# Patient Record
Sex: Male | Born: 1990 | Hispanic: No | Marital: Single | Smoking: Former smoker
Health system: Southern US, Community
[De-identification: ages and names within clinical notes are randomized; demographics above are authoritative.]

## PROBLEM LIST (undated history)

## (undated) DIAGNOSIS — R059 Cough, unspecified: Secondary | ICD-10-CM

## (undated) DIAGNOSIS — B019 Varicella without complication: Secondary | ICD-10-CM

## (undated) DIAGNOSIS — J039 Acute tonsillitis, unspecified: Secondary | ICD-10-CM

## (undated) DIAGNOSIS — R05 Cough: Secondary | ICD-10-CM

## (undated) HISTORY — DX: Varicella without complication: B01.9

## (undated) HISTORY — PX: NO PAST SURGERIES: SHX2092

## (undated) HISTORY — DX: Acute tonsillitis, unspecified: J03.90

## (undated) HISTORY — DX: Cough: R05

## (undated) HISTORY — DX: Cough, unspecified: R05.9

---

## 2014-03-26 ENCOUNTER — Emergency Department (HOSPITAL_COMMUNITY): Payer: PRIVATE HEALTH INSURANCE

## 2014-03-26 ENCOUNTER — Encounter (HOSPITAL_COMMUNITY): Payer: Self-pay | Admitting: *Deleted

## 2014-03-26 ENCOUNTER — Emergency Department (INDEPENDENT_AMBULATORY_CARE_PROVIDER_SITE_OTHER): Payer: PRIVATE HEALTH INSURANCE

## 2014-03-26 ENCOUNTER — Emergency Department (INDEPENDENT_AMBULATORY_CARE_PROVIDER_SITE_OTHER)
Admission: EM | Admit: 2014-03-26 | Discharge: 2014-03-26 | Disposition: A | Discharge status: Home / Self Care | Payer: PRIVATE HEALTH INSURANCE | Source: Home / Self Care | Attending: Family Medicine | Admitting: Family Medicine

## 2014-03-26 DIAGNOSIS — R509 Fever, unspecified: Secondary | ICD-10-CM | POA: Diagnosis not present

## 2014-03-26 LAB — COMPREHENSIVE METABOLIC PANEL
ALBUMIN: 4.4 g/dL (ref 3.5–5.2)
ALT: 37 U/L (ref 0–53)
ANION GAP: 9 (ref 5–15)
AST: 30 U/L (ref 0–37)
Alkaline Phosphatase: 86 U/L (ref 39–117)
BILIRUBIN TOTAL: 0.7 mg/dL (ref 0.3–1.2)
BUN: 7 mg/dL (ref 6–23)
CO2: 28 mmol/L (ref 19–32)
CREATININE: 0.83 mg/dL (ref 0.50–1.35)
Calcium: 9.5 mg/dL (ref 8.4–10.5)
Chloride: 101 mmol/L (ref 96–112)
GLUCOSE: 96 mg/dL (ref 70–99)
Potassium: 3.7 mmol/L (ref 3.5–5.1)
Sodium: 138 mmol/L (ref 135–145)
Total Protein: 8 g/dL (ref 6.0–8.3)

## 2014-03-26 LAB — POCT URINALYSIS DIP (DEVICE)
BILIRUBIN URINE: NEGATIVE
Glucose, UA: NEGATIVE mg/dL
Ketones, ur: NEGATIVE mg/dL
Leukocytes, UA: NEGATIVE
Nitrite: NEGATIVE
Protein, ur: NEGATIVE mg/dL
SPECIFIC GRAVITY, URINE: 1.02 (ref 1.005–1.030)
Urobilinogen, UA: 0.2 mg/dL (ref 0.0–1.0)
pH: 6.5 (ref 5.0–8.0)

## 2014-03-26 LAB — CBC WITH DIFFERENTIAL/PLATELET
Basophils Absolute: 0 10*3/uL (ref 0.0–0.1)
Basophils Relative: 0 % (ref 0–1)
EOS ABS: 0.1 10*3/uL (ref 0.0–0.7)
EOS PCT: 1 % (ref 0–5)
HEMATOCRIT: 47.4 % (ref 39.0–52.0)
Hemoglobin: 16.4 g/dL (ref 13.0–17.0)
Lymphocytes Relative: 29 % (ref 12–46)
Lymphs Abs: 2.3 10*3/uL (ref 0.7–4.0)
MCH: 29.9 pg (ref 26.0–34.0)
MCHC: 34.6 g/dL (ref 30.0–36.0)
MCV: 86.3 fL (ref 78.0–100.0)
Monocytes Absolute: 0.7 10*3/uL (ref 0.1–1.0)
Monocytes Relative: 8 % (ref 3–12)
Neutro Abs: 4.9 10*3/uL (ref 1.7–7.7)
Neutrophils Relative %: 62 % (ref 43–77)
Platelets: 313 10*3/uL (ref 150–400)
RBC: 5.49 MIL/uL (ref 4.22–5.81)
RDW: 12.2 % (ref 11.5–15.5)
WBC: 8 10*3/uL (ref 4.0–10.5)

## 2014-03-26 LAB — POCT INFECTIOUS MONO SCREEN: MONO SCREEN: NEGATIVE

## 2014-03-26 NOTE — ED Provider Notes (Signed)
CSN: [id number]     Arrival date & time 03/26/14  4098 History   First MD Initiated Contact with Patient 03/26/14 1025     Chief Complaint  Patient presents with  . Fever   (Consider location/radiation/quality/duration/timing/severity/associated sxs/prior Treatment) HPI Comments: Patient reports that about 2 weeks ago he experienced 5-6 days of daily fever without any associated symptoms other than mild general malaise while febrile. States he then experienced a 3-4 day period without fever. Then on 03/21/2014 her reports he began having daily febrile episodes and has done so every day since. Using tylenol and ibuprofen at home with relief.  No known ill contacts. No recent international travel No previous episodes PCP: none Reports himself to be otherwise healthy Works in IT/software development  Patient is a 24 y.o. male presenting with fever. The history is provided by the patient.  Fever Associated symptoms: no chills     History reviewed. No pertinent past medical history. History reviewed. No pertinent past surgical history. History reviewed. No pertinent family history. History  Substance Use Topics  . Smoking status: Never Smoker   . Smokeless tobacco: Not on file  . Alcohol Use: Yes     Comment: occasonally    Review of Systems  Constitutional: Positive for fever. Negative for chills, diaphoresis, activity change, appetite change, fatigue and unexpected weight change.  HENT: Negative.   Eyes: Negative.   Respiratory: Negative.   Cardiovascular: Negative.   Gastrointestinal: Negative.   Genitourinary: Negative.   Musculoskeletal: Negative.   Skin: Negative.   Allergic/Immunologic: Negative for immunocompromised state.  Neurological: Negative for dizziness, weakness and light-headedness.  Hematological: Negative for adenopathy.    Allergies  Review of patient's allergies indicates no known allergies.  Home Medications   Prior to Admission medications    Medication Sig Start Date End Date Taking? Authorizing Provider  Acetaminophen (TYLENOL PO) Take by mouth.   Yes Historical Provider, MD   BP 122/80 mmHg  Pulse 72  Temp(Src) 98.6 F (37 C) (Oral)  Resp 16  SpO2 100% Physical Exam  Constitutional: He is oriented to person, place, and time. He appears well-developed and well-nourished. No distress.  HENT:  Head: Normocephalic and atraumatic.  Right Ear: Hearing, tympanic membrane, external ear and ear canal normal.  Left Ear: Hearing, tympanic membrane, external ear and ear canal normal.  Nose: Nose normal.  Mouth/Throat: Uvula is midline, oropharynx is clear and moist and mucous membranes are normal.  Eyes: Conjunctivae are normal. No scleral icterus.  Neck: Normal range of motion. Neck supple.  Cardiovascular: Normal rate, regular rhythm and normal heart sounds.   Pulmonary/Chest: Effort normal and breath sounds normal.  Abdominal: Soft. Normal appearance and bowel sounds are normal. He exhibits no distension. There is no hepatosplenomegaly. There is no tenderness. There is no CVA tenderness.  Musculoskeletal: Normal range of motion.  Lymphadenopathy:    He has no cervical adenopathy.  Neurological: He is alert and oriented to person, place, and time.  Skin: Skin is warm and dry. No rash noted. No erythema.  Psychiatric: He has a normal mood and affect. His behavior is normal.  Nursing note and vitals reviewed.   ED Course  Procedures (including critical care time) Labs Review Labs Reviewed  POCT URINALYSIS DIP (DEVICE) - Abnormal; Notable for the following:    Hgb urine dipstick TRACE (*)    All other components within normal limits  CBC WITH DIFFERENTIAL/PLATELET  COMPREHENSIVE METABOLIC PANEL  POCT INFECTIOUS MONO SCREEN    Imaging Review  Dg Chest 2 View  03/26/2014   CLINICAL DATA:  Fever for 5 days  EXAM: CHEST  2 VIEW  COMPARISON:  None.  FINDINGS: Lungs are clear. Heart size and pulmonary vascularity are normal.  No adenopathy. No bone lesions.  IMPRESSION: No edema or consolidation.   Electronically Signed   By: Bretta BangWilliam  Woodruff III M.D.   On: 03/26/2014 11:43     MDM   1. Febrile illness   2. Intermittent FUO    Your evaluation here was normal and without explanation for your fevers. I would recommend that you either follow up with the primary care doctor of your choice or the Infectious Disease Clinic listed on your discharge paperwork. Attached are copies of your lab work and xrays for follow up. If symptoms become suddenly worse or severe, please have yourself evaluated at your nearest ER.    Ria ClockJennifer Lee H Misty Foutz, [place] 03/26/14 1253

## 2014-03-26 NOTE — Discharge Instructions (Signed)
Your evaluation here was normal and without explanation for your fevers. I would recommend that you either follow up with the primary care doctor of your choice or the Infectious Disease Clinic listed on your discharge paperwork. Attached are copies of your lab work and xrays for follow up. If symptoms become suddenly worse or severe, please have yourself evaluated at your nearest ER.  Fever, Adult A fever is a higher than normal body temperature. In an adult, an oral temperature around 98.6 F (37 C) is considered normal. A temperature of 100.4 F (38 C) or higher is generally considered a fever. Mild or moderate fevers generally have no long-term effects and often do not require treatment. Extreme fever (greater than or equal to 106 F or 41.1 C) can cause seizures. The sweating that may occur with repeated or prolonged fever may cause dehydration. Elderly people can develop confusion during a fever. A measured temperature can vary with:  Age.  Time of day.  Method of measurement (mouth, underarm, rectal, or ear). The fever is confirmed by taking a temperature with a thermometer. Temperatures can be taken different ways. Some methods are accurate and some are not.  An oral temperature is used most commonly. Electronic thermometers are fast and accurate.  An ear temperature will only be accurate if the thermometer is positioned as recommended by the manufacturer.  A rectal temperature is accurate and done for those adults who have a condition where an oral temperature cannot be taken.  An underarm (axillary) temperature is not accurate and not recommended. Fever is a symptom, not a disease.  CAUSES   Infections commonly cause fever.  Some noninfectious causes for fever include:  Some arthritis conditions.  Some thyroid or adrenal gland conditions.  Some immune system conditions.  Some types of cancer.  A medicine reaction.  High doses of certain street drugs such as  methamphetamine.  Dehydration.  Exposure to high outside or room temperatures.  Occasionally, the source of a fever cannot be determined. This is sometimes called a "fever of unknown origin" (FUO).  Some situations may lead to a temporary rise in body temperature that may go away on its own. Examples are:  Childbirth.  Surgery.  Intense exercise. HOME CARE INSTRUCTIONS   Take appropriate medicines for fever. Follow dosing instructions carefully. If you use acetaminophen to reduce the fever, be careful to avoid taking other medicines that also contain acetaminophen. Do not take aspirin for a fever if you are younger than age 26. There is an association with Reye's syndrome. Reye's syndrome is a rare but potentially deadly disease.  If an infection is present and antibiotics have been prescribed, take them as directed. Finish them even if you start to feel better.  Rest as needed.  Maintain an adequate fluid intake. To prevent dehydration during an illness with prolonged or recurrent fever, you may need to drink extra fluid.Drink enough fluids to keep your urine clear or pale yellow.  Sponging or bathing with room temperature water may help reduce body temperature. Do not use ice water or alcohol sponge baths.  Dress comfortably, but do not over-bundle. SEEK MEDICAL CARE IF:   You are unable to keep fluids down.  You develop vomiting or diarrhea.  You are not feeling at least partly better after 3 days.  You develop new symptoms or problems. SEEK IMMEDIATE MEDICAL CARE IF:   You have shortness of breath or trouble breathing.  You develop excessive weakness.  You are dizzy or you  faint.  You are extremely thirsty or you are making little or no urine.  You develop new pain that was not there before (such as in the head, neck, chest, back, or abdomen).  You have persistent vomiting and diarrhea for more than 1 to 2 days.  You develop a stiff neck or your eyes become  sensitive to light.  You develop a skin rash.  You have a fever or persistent symptoms for more than 2 to 3 days.  You have a fever and your symptoms suddenly get worse. MAKE SURE YOU:   Understand these instructions.  Will watch your condition.  Will get help right away if you are not doing well or get worse. Document Released: 07/26/2000 Document Revised: 06/16/2013 Document Reviewed: 12/01/2010 [facility] Patient Information 2015 [place], [place]. This information is not intended to replace advice given to you by your health care provider. Make sure you discuss any questions you have with your health care provider.

## 2014-03-26 NOTE — ED Notes (Signed)
Pt  States  He  Has  Had  fever   And  Body  Aches  /  Weakness   For     5   Days      -       He  Reports  He  Took  Tylenol       -     denys  Any  sorethroat       Reports  A  Sensation  Of  What  He  Describes  As  Burning  In  His  Ears

## 2014-03-26 NOTE — ED Notes (Signed)
Patient was not cooperative when I went to draw the blood for testing, would jerk arm, refused to stay still. Reported to the provider Narda BondsLee Presson patient's behavior

## 2014-09-14 ENCOUNTER — Emergency Department (INDEPENDENT_AMBULATORY_CARE_PROVIDER_SITE_OTHER)
Admission: EM | Admit: 2014-09-14 | Discharge: 2014-09-14 | Disposition: A | Discharge status: Home / Self Care | Payer: PRIVATE HEALTH INSURANCE | Source: Home / Self Care | Attending: Family Medicine | Admitting: Family Medicine

## 2014-09-14 ENCOUNTER — Encounter (HOSPITAL_COMMUNITY): Payer: Self-pay | Admitting: Emergency Medicine

## 2014-09-14 DIAGNOSIS — J029 Acute pharyngitis, unspecified: Secondary | ICD-10-CM

## 2014-09-14 LAB — POCT RAPID STREP A: Streptococcus, Group A Screen (Direct): NEGATIVE

## 2014-09-14 MED ORDER — IPRATROPIUM BROMIDE 0.06 % NA SOLN: 2.0000 | Freq: Four times a day (QID) | NASAL | Status: DC

## 2014-09-14 NOTE — ED Notes (Signed)
C/o ST onset 7-10 days associated w/fevers, chills, odynophagia Alert, no signs of acute distress.

## 2014-09-14 NOTE — ED Provider Notes (Signed)
CSN: [id number]     Arrival date & time 09/14/14  1752 History   First MD Initiated Contact with Patient 09/14/14 1945     Chief Complaint  Patient presents with  . Sore Throat   (Consider location/radiation/quality/duration/timing/severity/associated sxs/prior Treatment) Patient is a 24 y.o. male presenting with pharyngitis. The history is provided by the patient.  Sore Throat This is a new problem. The current episode started more than 1 week ago. The problem has not changed since onset.The symptoms are aggravated by swallowing.    History reviewed. No pertinent past medical history. History reviewed. No pertinent past surgical history. No family history on file. History  Substance Use Topics  . Smoking status: Never Smoker   . Smokeless tobacco: Not on file  . Alcohol Use: Yes     Comment: occasonally    Review of Systems  Constitutional: Positive for fever.  HENT: Positive for sore throat.     Allergies  Review of patient's allergies indicates no known allergies.  Home Medications   Prior to Admission medications   Medication Sig Start Date End Date Taking? Authorizing Provider  Acetaminophen (TYLENOL PO) Take by mouth.    Historical Provider, MD  ipratropium (ATROVENT) 0.06 % nasal spray Place 2 sprays into both nostrils 4 (four) times daily. 09/14/14   Linna Hoff, MD   BP 126/82 mmHg  Pulse 63  Temp(Src) 98.4 F (36.9 C) (Oral)  Resp 18  SpO2 99% Physical Exam  Constitutional: He is oriented to person, place, and time. He appears well-developed and well-nourished. No distress.  HENT:  Head: Normocephalic.  Right Ear: External ear normal.  Left Ear: External ear normal.  Mouth/Throat: Oropharynx is clear and moist.  Eyes: Conjunctivae are normal. Pupils are equal, round, and reactive to light.  Neck: Normal range of motion. Neck supple.  Lymphadenopathy:    He has no cervical adenopathy.  Neurological: He is alert and oriented to person, place, and time.   Skin: Skin is warm and dry.  Nursing note and vitals reviewed.   ED Course  Procedures (including critical care time) Labs Review Labs Reviewed  POCT RAPID STREP A    Imaging Review No results found.   MDM   1. Acute pharyngitis, unspecified pharyngitis type        Linna Hoff, MD 09/14/14 1958

## 2014-09-17 LAB — CULTURE, GROUP A STREP: STREP A CULTURE: NEGATIVE

## 2014-09-17 NOTE — ED Notes (Signed)
Final report negative 

## 2015-04-19 ENCOUNTER — Ambulatory Visit (INDEPENDENT_AMBULATORY_CARE_PROVIDER_SITE_OTHER)
Admission: RE | Admit: 2015-04-19 | Discharge: 2015-04-19 | Disposition: A | Discharge status: Home / Self Care | Payer: PRIVATE HEALTH INSURANCE | Source: Ambulatory Visit | Attending: Pulmonary Disease | Admitting: Pulmonary Disease

## 2015-04-19 ENCOUNTER — Telehealth: Payer: Self-pay | Admitting: Pulmonary Disease

## 2015-04-19 ENCOUNTER — Ambulatory Visit (INDEPENDENT_AMBULATORY_CARE_PROVIDER_SITE_OTHER): Payer: PRIVATE HEALTH INSURANCE | Admitting: Pulmonary Disease

## 2015-04-19 ENCOUNTER — Encounter: Payer: Self-pay | Admitting: Pulmonary Disease

## 2015-04-19 VITALS — BP 112/66 | HR 78 | Ht 72.0 in | Wt 203.2 lb

## 2015-04-19 DIAGNOSIS — R05 Cough: Secondary | ICD-10-CM | POA: Diagnosis not present

## 2015-04-19 DIAGNOSIS — R059 Cough, unspecified: Secondary | ICD-10-CM

## 2015-04-19 MED ORDER — BENZONATATE 100 MG PO CAPS: 100.0000 mg | ORAL_CAPSULE | Freq: Three times a day (TID) | ORAL | Status: DC | PRN

## 2015-04-19 NOTE — Telephone Encounter (Signed)
IMAGING CXR PA/LAT 03/26/14 (personally reviewed by me): No parenchymal opacity or mass appreciated.No pleural effusion or thickening. Heart normal in size. Mediastinum normal in contour.  LABS 03/26/14 CBC: 8.0/16.4/47.4/313 BMP: 138/3.7/101/28/7/0.83/96/9.5 LFT: 4.4/8.0/0.7/86/30/37

## 2015-04-19 NOTE — Progress Notes (Signed)
Subjective:    Patient ID: Joe Landry, male    DOB: [birth year], 25 y.o.   MRN: [id number]  HPI He reports a history of tonsillitis as a child but no breathing problems. No history of childhood allergies. He reports he developed a cough in August that lasted for 2 months and spontaneously resolved. He was taking Nyquil OTC. He reports he started having a cough in December 2016. He was seen in Urgent Care in December and prescribed Prednisone, Clarithromycin, & a cough syrup. He reports now his cough is worse in the afternoon & evenings. He drinks hot water to help his cough. Denies any nocturnal awakenings with coughing. He does cough some in the morning as well. He reports his cough is not progressing but also not resolving. He does report a cough when laughing. He reports that when his cough started he had a fever that he can recall. He reports he has had intermittent green phlegm that is now white. Denies any hemoptysis. He denies any sinus congestion, pressure, or drainage. He reports the sensation of "irritation" in the back of his throat chronically. Denies any dyspnea or wheezing. Denies any reflux, dyspepsia, or morning brash water taste. He denies any dysphagia or odynophagia. He reports an abnormal sensation in his throat with exposure to cold air. He reports his cough is worse with exposure to cold air. Denies any exacerbation of cough by perfumes. Denies any chest pain or pressure.  He does cough some when playing cricket.   Review of Systems No new rashes or bruising. Does have some mild eczema. No dysuria or hematuria. A pertinent 14 point review of systems is negative except as per the history of presenting illness.  No Known Allergies  Current Outpatient Prescriptions on File Prior to Visit  Medication Sig Dispense Refill  . Acetaminophen (TYLENOL PO) Take by mouth as needed.      No current facility-administered medications on file prior to visit.    Past Medical History    Diagnosis Date  . Cough   . Tonsillitis     multiple times as a child    Past Surgical History  Procedure Laterality Date  . None      Family History  Problem Relation Age of Onset  . Hypertension Father   . Lung disease Neg Hx     Social History   Social History  . Marital Status: Single    Spouse Name: N/A  . Number of Children: 0  . Years of Education: N/A   Occupational History  . Sport and exercise psychologist     at [employer]   Social History Main Topics  . Smoking status: Former Smoker -- 0.30 packs/day for 1.5 years    Types: Cigarettes    Quit date: 08/14/2014  . Smokeless tobacco: None     Comment: 3-4 cigs a daily  . Alcohol Use: 0.0 oz/week    0 Standard drinks or equivalent per week     Comment: occasonally  . Drug Use: No  . Sexual Activity: Not Asked   Other Topics Concern  . None   Social History Narrative   Originally from [place]. Came to [place] in 2011. Previously has lived in [place], [place], [place]. Currently works as a Sport and exercise psychologist. No pets currently. No bird, mold, or hot tub exposure. Enjoys playing cricket & taking drives.       Objective:   Physical Exam BP 112/66 mmHg  Pulse 78  Ht 6' (1.829 m)  [employer]  203 lb 3.2 oz (92.171 kg)  BMI 27.55 kg/m2  SpO2 98% General:  Young male. Awake. Alert. No acute distress but obviously anxious.  Integument:  Warm & dry. No rash on exposed skin. No bruising. Lymphatics:  No appreciated cervical or supraclavicular lymphadenoapthy. HEENT:  Moist mucus membranes. No oral ulcers. No scleral injection or icterus. PERRL. mildly swollen bilateral tonsils. Cardiovascular:  Regular rate. No edema. No appreciable JVD.  Pulmonary:  Good aeration & clear to auscultation bilaterally. Symmetric chest wall expansion. No accessory muscle use on room air. Abdomen: Soft. Normal bowel sounds. Nondistended. Grossly nontender. Musculoskeletal:  Normal bulk and tone. Hand grip strength 5/5 bilaterally. No joint deformity or effusion  appreciated. Neurological:  CN 2-12 grossly in tact. No meningismus. Moving all 4 extremities equally. Symmetric brachioradialis deep tendon reflexes. Psychiatric:  Mood and affect congruent. Speech normal rhythm, rate & tone.   IMAGING CXR PA/LAT 03/26/14 (personally reviewed by me): No parenchymal opacity or mass appreciated.No pleural effusion or thickening. Heart normal in size. Mediastinum normal in contour.  LABS 03/26/14 CBC: 8.0/16.4/47.4/313 BMP: 138/3.7/101/28/7/0.83/96/9.5 LFT: 4.4/8.0/0.7/86/30/37    Assessment & Plan:  25 year old male with history of a cough for approximately 2 months in late last summer with a persistent cough after an upper respiratory infection in December. I suspect the patient has a postinfectious cough but certainly this could be the start of asthma. He has no other respiratory symptoms at this time or suggestion of reflux. I instructed the patient to contact me if he developed any new symptoms or had any clinical worsening before his next appointment.  1. Cough: Checking chest x-ray PA/LAT today. 40 function testing at follow-up appointment. Cough suppression with Tessalon Perles. 2. Follow-up: Patient to return to clinic in 4-6 weeks.  Donna ChristenJennings E. Jamison NeighborNestor, M.D. [facility] Pager:  [phone] After 3pm or if no response, call [phone] 10:00 AM 04/19/2015

## 2015-04-19 NOTE — Patient Instructions (Signed)
   Take the Tessalon Perles I'm prescribing you to help with your cough  Please call me if your cough worsens, you start to cough up any blood, you develop any breathing problems, or you develop any fever, chills, or sweats.  You will have a breathing test at your next appointment checking for developing asthma.  I will see you back in 4-6 weeks but please call me if you have any problems before then.  TESTS ORDERED: 1. Chest x-ray PA/LAT today 2. Full pulmonary function testing at follow-up appointment

## 2015-04-20 ENCOUNTER — Telehealth: Payer: Self-pay | Admitting: Pulmonary Disease

## 2015-04-20 DIAGNOSIS — R05 Cough: Secondary | ICD-10-CM

## 2015-04-20 DIAGNOSIS — R059 Cough, unspecified: Secondary | ICD-10-CM

## 2015-04-20 NOTE — Telephone Encounter (Signed)
LVM for pt to return call

## 2015-04-20 NOTE — Telephone Encounter (Signed)
Spoke with pt, aware of recs.  ent referral placed.  Nothing further needed.

## 2015-04-20 NOTE — Telephone Encounter (Signed)
Pt returning call.Joe Landry ° °

## 2015-04-20 NOTE — Telephone Encounter (Signed)
Notes Recorded by Roslynn AmbleJennings E Nestor, MD on 04/19/2015 at 12:47 PM Please call the patient and let him know I reviewed his chest x-ray. There is no mass or focal opacity that would suggest pneumonia as a cause for his cough. He should continue with our current plan as discussed during his visit today. ------------------------ Spoke with pt, aware of cxr results.  Pt wants to know why he is still coughing if his lungs are clear-states the tessalon perles are not helping his cough.  Pt wants to know if his cough could be related to his tonsils.   Pt uses [facility] on [address] in [place].  JN please advise. Thanks!

## 2015-04-20 NOTE — Telephone Encounter (Signed)
It is possibly related to his tonsils. If the tessalon perles are not working please put in a referral to ENT for evaluation.  Thanks.

## 2015-04-20 NOTE — Telephone Encounter (Signed)
Patient returning call, CB is [phone]

## 2015-05-04 ENCOUNTER — Ambulatory Visit
Admission: RE | Admit: 2015-05-04 | Discharge: 2015-05-04 | Disposition: A | Discharge status: Home / Self Care | Payer: PRIVATE HEALTH INSURANCE | Source: Ambulatory Visit | Attending: Otolaryngology | Admitting: Otolaryngology

## 2015-05-04 ENCOUNTER — Other Ambulatory Visit: Payer: Self-pay | Admitting: Otolaryngology

## 2015-05-04 DIAGNOSIS — R05 Cough: Secondary | ICD-10-CM

## 2015-05-04 DIAGNOSIS — R059 Cough, unspecified: Secondary | ICD-10-CM

## 2015-06-09 ENCOUNTER — Ambulatory Visit: Payer: PRIVATE HEALTH INSURANCE | Admitting: Pulmonary Disease

## 2015-08-10 ENCOUNTER — Ambulatory Visit: Payer: PRIVATE HEALTH INSURANCE | Admitting: Family Medicine

## 2015-08-23 ENCOUNTER — Ambulatory Visit (INDEPENDENT_AMBULATORY_CARE_PROVIDER_SITE_OTHER): Payer: PRIVATE HEALTH INSURANCE | Admitting: Family Medicine

## 2015-08-23 ENCOUNTER — Encounter: Payer: Self-pay | Admitting: Family Medicine

## 2015-08-23 VITALS — BP 122/84 | HR 79 | Temp 97.5°F | Ht 70.25 in | Wt 194.0 lb

## 2015-08-23 DIAGNOSIS — A689 Relapsing fever, unspecified: Secondary | ICD-10-CM | POA: Diagnosis not present

## 2015-08-23 LAB — COMPREHENSIVE METABOLIC PANEL
ALT: 20 U/L (ref 0–53)
AST: 19 U/L (ref 0–37)
Albumin: 4.5 g/dL (ref 3.5–5.2)
Alkaline Phosphatase: 86 U/L (ref 39–117)
BILIRUBIN TOTAL: 0.6 mg/dL (ref 0.2–1.2)
BUN: 13 mg/dL (ref 6–23)
CO2: 28 meq/L (ref 19–32)
Calcium: 9.9 mg/dL (ref 8.4–10.5)
Chloride: 101 mEq/L (ref 96–112)
Creatinine, Ser: 0.85 mg/dL (ref 0.40–1.50)
GFR: 114.9 mL/min (ref >60.00)
Glucose, Bld: 89 mg/dL (ref 70–99)
POTASSIUM: 4 meq/L (ref 3.5–5.1)
SODIUM: 137 meq/L (ref 135–145)
Total Protein: 7.9 g/dL (ref 6.0–8.3)

## 2015-08-23 LAB — CBC WITH DIFFERENTIAL/PLATELET
Basophils Absolute: 0 10*3/uL (ref 0.0–0.1)
Basophils Relative: 0.6 % (ref 0.0–3.0)
EOS PCT: 2.9 % (ref 0.0–5.0)
Eosinophils Absolute: 0.2 10*3/uL (ref 0.0–0.7)
HCT: 50.4 % (ref 39.0–52.0)
Hemoglobin: 17.1 g/dL — ABNORMAL HIGH (ref 13.0–17.0)
Lymphocytes Relative: 24.3 % (ref 12.0–46.0)
Lymphs Abs: 2 10*3/uL (ref 0.7–4.0)
MCHC: 33.8 g/dL (ref 30.0–36.0)
MCV: 87.8 fl (ref 78.0–100.0)
Monocytes Absolute: 0.7 10*3/uL (ref 0.1–1.0)
Monocytes Relative: 8 % (ref 3.0–12.0)
NEUTROS PCT: 64.2 % (ref 43.0–77.0)
Neutro Abs: 5.4 10*3/uL (ref 1.4–7.7)
Platelets: 307 10*3/uL (ref 150.0–400.0)
RBC: 5.74 Mil/uL (ref 4.22–5.81)
RDW: 12.9 % (ref 11.5–15.5)
WBC: 8.3 10*3/uL (ref 4.0–10.5)

## 2015-08-23 LAB — TSH: TSH: 1.01 u[IU]/mL (ref 0.35–4.50)

## 2015-08-23 NOTE — Progress Notes (Signed)
Pre visit review using our clinic review tool, if applicable. No additional management support is needed unless otherwise documented below in the visit note. 

## 2015-08-23 NOTE — Patient Instructions (Addendum)
Your exam was normal.  Please buy a thermometer and take your temperature when you feel poorly (we need to confirm that this is actually fever).  We will call with your lab results.   Take care  Dr. Adriana Simasook

## 2015-08-24 ENCOUNTER — Encounter: Payer: Self-pay | Admitting: Family Medicine

## 2015-08-24 DIAGNOSIS — A689 Relapsing fever, unspecified: Secondary | ICD-10-CM | POA: Insufficient documentation

## 2015-08-24 LAB — EPSTEIN-BARR VIRUS VCA, IGM: EBV VCA IgM: <36 U/mL

## 2015-08-24 LAB — EPSTEIN-BARR VIRUS VCA, IGG: EBV VCA IgG: 133 U/mL — ABNORMAL HIGH

## 2015-08-24 LAB — CMV IGM: CMV IgM: <30 AU/mL (ref <30.00)

## 2015-08-24 LAB — PATHOLOGIST SMEAR REVIEW

## 2015-08-24 NOTE — Assessment & Plan Note (Signed)
New problem. Unclear etiology/diagnosis this time. Unsure of the validity of his fever as he has never taken his temperature. Exam normal.  No recent travel or other symptoms (other than weakness/fatigue post exercise). Obtaining laboratory studies today. See orders. Questionable psychosomatic. Patient did ask me for a full body scan in the office today. Will follow closely. Await labs.

## 2015-08-24 NOTE — Progress Notes (Signed)
Subjective:  Patient ID: Joe Landry, male    DOB: 1991-01-29  Age: 25 y.o. MRN: [id number]  CC: Fever, chills, weakness  HPI Damein Gaida is a 25 y.o. male presents to the clinic today with the above complaints.  Patient states that he's had intermittent fever and chills for the past 2 months. He states that it occurs after he exercises for 4-5 consecutive days. He states that he returns from the gym and feels hot and later has chills. He also reports weakness. Of note, he has never taken his temperature. I am not sure if he actually has had fever. This is subjective fever. No recent travel. No new medications. He states that he uses ibuprofen. He states that it subsequently resolves and recurs again after an additional 4-5 consecutive days in the gym. He denies any other associated symptoms. He has taken ibuprofen for this. No known exacerbating or relieving factors. No other complaints at this time.  PMH, Surgical Hx, Family Hx, Social History reviewed and updated as below.  Past Medical History  Diagnosis Date  . Cough   . Tonsillitis     multiple times as a child  . Chicken pox    Past Surgical History  Procedure Laterality Date  . No past surgeries     Family History  Problem Relation Age of Onset  . Hypertension Father   . Lung disease Neg Hx    Social History  Substance Use Topics  . Smoking status: Former Smoker -- 0.30 packs/day for 1.5 years    Types: Cigarettes    Quit date: 08/14/2014  . Smokeless tobacco: Never Used     Comment: 3-4 cigs a daily  . Alcohol Use: 0.6 - 1.2 oz/week    1-2 Standard drinks or equivalent per week     Comment: occasonally   Review of Systems  Constitutional: Positive for chills.       Subjective fever.  Neurological: Positive for weakness.  All other systems reviewed and are negative.  Objective:   Today's Vitals: BP 122/84 mmHg  Pulse 79  Temp(Src) 97.5 F (36.4 C) (Oral)  Ht 5' 10.25" (1.784 m)  Wt 194 lb (87.998  kg)  BMI 27.65 kg/m2  SpO2 96%  Physical Exam  Constitutional: He is oriented to person, place, and time. He appears well-developed and well-nourished. No distress.  HENT:  Head: Normocephalic and atraumatic.  Nose: Nose normal.  Mouth/Throat: Oropharynx is clear and moist. No oropharyngeal exudate.  Normal TM's bilaterally.   Eyes: Conjunctivae are normal. No scleral icterus.  Neck: Neck supple. No thyromegaly present.  Cardiovascular: Normal rate and regular rhythm.   No murmur heard. Pulmonary/Chest: Effort normal and breath sounds normal. He has no wheezes. He has no rales.  Abdominal: Soft. He exhibits no distension. There is no tenderness. There is no rebound and no guarding.  Musculoskeletal: Normal range of motion. He exhibits no edema.  Lymphadenopathy:    He has no cervical adenopathy.  Neurological: He is alert and oriented to person, place, and time.  Skin: Skin is warm and dry. No rash noted.  Psychiatric: He has a normal mood and affect.  Vitals reviewed.  Assessment & Plan:   Problem List Items Addressed This Visit    Recurrent fever - Primary    New problem. Unclear etiology/diagnosis this time. Unsure of the validity of his fever as he has never taken his temperature. Exam normal.  No recent travel or other symptoms (other than weakness/fatigue post exercise). Obtaining  laboratory studies today. See orders. Questionable psychosomatic. Patient did ask me for a full body scan in the office today. Will follow closely. Await labs.      Relevant Orders   CBC w/Diff (Completed)   Comp Met (CMET) (Completed)   CMV IgM   TSH (Completed)   Epstein-Barr virus VCA, IgG   Epstein-Barr virus VCA, IgM   Pathologist smear review      Outpatient Encounter Prescriptions as of 08/23/2015  Medication Sig  . [DISCONTINUED] Mouthwash Compounding Base (MOUTH WASH-GP) LIQD Take 15 mLs by mouth.  . [DISCONTINUED] Acetaminophen (TYLENOL PO) Take by mouth as needed.   .  [DISCONTINUED] benzonatate (TESSALON) 100 MG capsule Take 1-2 capsules (100-200 mg total) by mouth 3 (three) times daily as needed for cough.   No facility-administered encounter medications on file as of 08/23/2015.   Follow-up: PRN  [place]

## 2015-08-25 ENCOUNTER — Telehealth: Payer: Self-pay | Admitting: Family Medicine

## 2015-08-25 NOTE — Telephone Encounter (Signed)
Spoke with patient, see result note for details. thanks 

## 2015-08-25 NOTE — Telephone Encounter (Signed)
Pt called about his lab results. Please call him.

## 2015-09-07 ENCOUNTER — Other Ambulatory Visit: Payer: Self-pay | Admitting: Family Medicine

## 2015-09-07 ENCOUNTER — Telehealth: Payer: Self-pay

## 2015-09-07 DIAGNOSIS — D582 Other hemoglobinopathies: Secondary | ICD-10-CM

## 2015-09-07 NOTE — Telephone Encounter (Signed)
Pt coming for labs 09/08/15. Needs future orders placed. Is repeat CBC all that is needed?

## 2015-09-08 ENCOUNTER — Other Ambulatory Visit: Payer: PRIVATE HEALTH INSURANCE

## 2015-09-08 ENCOUNTER — Telehealth: Payer: Self-pay | Admitting: Family Medicine

## 2015-09-08 ENCOUNTER — Other Ambulatory Visit (INDEPENDENT_AMBULATORY_CARE_PROVIDER_SITE_OTHER): Payer: PRIVATE HEALTH INSURANCE

## 2015-09-08 DIAGNOSIS — D582 Other hemoglobinopathies: Secondary | ICD-10-CM | POA: Diagnosis not present

## 2015-09-08 LAB — CBC
HCT: 51.6 % (ref 39.0–52.0)
Hemoglobin: 17.2 g/dL — ABNORMAL HIGH (ref 13.0–17.0)
MCHC: 33.4 g/dL (ref 30.0–36.0)
MCV: 89.2 fl (ref 78.0–100.0)
Platelets: 339 K/uL (ref 150.0–400.0)
RBC: 5.79 Mil/uL (ref 4.22–5.81)
RDW: 12.9 % (ref 11.5–15.5)
WBC: 7.9 K/uL (ref 4.0–10.5)

## 2015-09-08 NOTE — Telephone Encounter (Signed)
Spoke with patient at the office, gave results and he agreed to have redrawn labs performed today. thanks

## 2015-09-08 NOTE — Progress Notes (Signed)
Spoke with patient, he is extremely concerned about his labs, initially didn't want to have redraw completed today. Reviewed prior results with him and discussed rationale for redraw.  He agreed to have one tube drawn for specimen.  Sent to lab, patient put in nurse room as last lab visit he passed out.  Labs completed

## 2015-09-08 NOTE — Telephone Encounter (Signed)
Pt is requesting that his most resent labs be sent to him by mail.   Thank you

## 2015-09-10 ENCOUNTER — Other Ambulatory Visit: Payer: Self-pay | Admitting: Family Medicine

## 2015-09-10 DIAGNOSIS — D582 Other hemoglobinopathies: Secondary | ICD-10-CM

## 2015-09-23 NOTE — Telephone Encounter (Signed)
Pt called back and left a msg wanting to have his results re read to him again. Thank you!  Call pt @ [phone].

## 2015-09-23 NOTE — Telephone Encounter (Signed)
Please advise thanks.

## 2015-09-28 NOTE — Telephone Encounter (Signed)
Patient was re-read his labs and given appointment time with hematology. Patient will be calling Dr.Finnegan's office.

## 2015-10-06 ENCOUNTER — Telehealth: Payer: Self-pay | Admitting: Family Medicine

## 2015-10-06 DIAGNOSIS — D751 Secondary polycythemia: Secondary | ICD-10-CM | POA: Insufficient documentation

## 2015-10-06 NOTE — Telephone Encounter (Signed)
Patient has been informed.

## 2015-10-06 NOTE — Telephone Encounter (Signed)
Pt called wanting to know what's going be done when he goes to the oncologist tomorrow ? Please advise?  Call pt @ [phone]. Thank you!

## 2015-10-06 NOTE — Progress Notes (Signed)
[facility]  Telephone:(336) [phone] Fax:(336) [phone]  ID: Joe Landry OB: 11/17/1990  MR#: [id number]  YQM#:[id number]#:[id number]  Patient Care Team: Tommie SamsJayce G Cook, DO as PCP - General (Family Medicine)  CHIEF COMPLAINT: Polycythemia.  INTERVAL HISTORY: Patient is a 25 year old male who was found to have a mildly elevated hemoglobin on routine blood work. He is anxious, but otherwise feels well. He has no neurologic complaints. He denies any recent fevers or illnesses. He has good appetite and denies weight loss. He denies any pain. He has no chest pain or shortness of breath. He denies any nausea, vomiting, constipation, or diarrhea. He has no urinary complaints. Patient otherwise feels well and offers no further specific complaints.  REVIEW OF SYSTEMS:   Review of Systems  Constitutional: Negative.  Negative for fever, malaise/fatigue and weight loss.  Respiratory: Negative.  Negative for cough and shortness of breath.   Cardiovascular: Negative.  Negative for chest pain.  Gastrointestinal: Negative.  Negative for abdominal pain.  Genitourinary: Negative.   Musculoskeletal: Negative.   Neurological: Negative.  Negative for weakness.  Psychiatric/Behavioral: The patient is nervous/anxious.     As per HPI. Otherwise, a complete review of systems is negative.  PAST MEDICAL HISTORY: Past Medical History:  Diagnosis Date  . Chicken pox   . Cough   . Tonsillitis    multiple times as a child    PAST SURGICAL HISTORY: Past Surgical History:  Procedure Laterality Date  . NO PAST SURGERIES      FAMILY HISTORY: Family History  Problem Relation Age of Onset  . Hypertension Father   . Lung disease Neg Hx        ADVANCED DIRECTIVES (Y/N):  N   HEALTH MAINTENANCE: Social History  Substance Use Topics  . Smoking status: Former Smoker    Packs/day: 0.30    Years: 1.50    Types: Cigarettes    Quit date: 08/14/2014  . Smokeless tobacco: Never Used     Comment:  3-4 cigs a daily  . Alcohol use 0.6 - 1.2 oz/week    1 - 2 Standard drinks or equivalent per week     Comment: occasonally     Colonoscopy:  PAP:  Bone density:  Lipid panel:  No Known Allergies  No current outpatient prescriptions on file.   No current facility-administered medications for this visit.     OBJECTIVE: Vitals:   10/07/15 1024  BP: (!) 142/82  Pulse: 94  Temp: (!) 96.9 F (36.1 C)     There is no height or weight on file to calculate BMI.    ECOG FS:0 - Asymptomatic  General: Well-developed, well-nourished, no acute distress. Eyes: Pink conjunctiva, anicteric sclera. HEENT: Normocephalic, moist mucous membranes, clear oropharnyx. Lungs: Clear to auscultation bilaterally. Heart: Regular rate and rhythm. No rubs, murmurs, or gallops. Abdomen: Soft, nontender, nondistended. No organomegaly noted, normoactive bowel sounds. Musculoskeletal: No edema, cyanosis, or clubbing. Neuro: Alert, answering all questions appropriately. Cranial nerves grossly intact. Skin: No rashes or petechiae noted. Psych: Normal affect. Lymphatics: No cervical, calvicular, axillary or inguinal LAD.   LAB RESULTS:  Lab Results  Component Value Date   NA 137 08/23/2015   K 4.0 08/23/2015   CL 101 08/23/2015   CO2 28 08/23/2015   GLUCOSE 89 08/23/2015   BUN 13 08/23/2015   CREATININE 0.85 08/23/2015   CALCIUM 9.9 08/23/2015   PROT 7.9 08/23/2015   ALBUMIN 4.5 08/23/2015   AST 19 08/23/2015   ALT 20 08/23/2015  ALKPHOS 86 08/23/2015   BILITOT 0.6 08/23/2015   GFRNONAA >90 03/26/2014   GFRAA >90 03/26/2014    Lab Results  Component Value Date   WBC 7.7 10/07/2015   NEUTROABS 5.4 08/23/2015   HGB 17.1 10/07/2015   HCT 49.4 10/07/2015   MCV 87.4 10/07/2015   PLT 290 10/07/2015   Lab Results  Component Value Date   IRON 97 10/07/2015   TIBC 381 10/07/2015   IRONPCTSAT 26 10/07/2015   Lab Results  Component Value Date   FERRITIN 76 10/07/2015      STUDIES: No results found.  ASSESSMENT: Polycythemia  PLAN:    1. Polycythemia: Patient's hemoglobin remained stable at 17.1. Iron stores, erythropoietin, and carbon monoxide levels are within normal limits. JAK-2 mutation is pending at time of dictation. No intervention is needed at this time. Return to clinic in 3 months with repeat laboratory work and further evaluation. If hemoglobin remains stable, patient can likely be discharged from clinic.  Approximately 35 minutes was spent in discussion of which greater than 50% was consultation.  Patient expressed understanding and was in agreement with this plan. He also understands that He can call clinic at any time with any questions, concerns, or complaints.    Jeralyn Ruthsimothy J Finnegan, MD   10/10/2015 3:29 PM

## 2015-10-07 ENCOUNTER — Inpatient Hospital Stay: Payer: PRIVATE HEALTH INSURANCE | Attending: Oncology | Admitting: Oncology

## 2015-10-07 ENCOUNTER — Encounter (INDEPENDENT_AMBULATORY_CARE_PROVIDER_SITE_OTHER): Payer: Self-pay

## 2015-10-07 ENCOUNTER — Inpatient Hospital Stay: Payer: PRIVATE HEALTH INSURANCE

## 2015-10-07 DIAGNOSIS — Z87891 Personal history of nicotine dependence: Secondary | ICD-10-CM | POA: Diagnosis not present

## 2015-10-07 DIAGNOSIS — F419 Anxiety disorder, unspecified: Secondary | ICD-10-CM | POA: Diagnosis not present

## 2015-10-07 DIAGNOSIS — D751 Secondary polycythemia: Secondary | ICD-10-CM

## 2015-10-07 LAB — IRON AND TIBC
IRON: 97 ug/dL (ref 45–182)
Saturation Ratios: 26 % (ref 17.9–39.5)
TIBC: 381 ug/dL (ref 250–450)
UIBC: 284 ug/dL

## 2015-10-07 LAB — CBC
HEMATOCRIT: 49.4 % (ref 40.0–52.0)
HEMOGLOBIN: 17.1 g/dL (ref 13.0–18.0)
MCH: 30.2 pg (ref 26.0–34.0)
MCHC: 34.6 g/dL (ref 32.0–36.0)
MCV: 87.4 fL (ref 80.0–100.0)
Platelets: 290 10*3/uL (ref 150–440)
RBC: 5.65 MIL/uL (ref 4.40–5.90)
RDW: 12.9 % (ref 11.5–14.5)
WBC: 7.7 10*3/uL (ref 3.8–10.6)

## 2015-10-07 LAB — FERRITIN: FERRITIN: 76 ng/mL (ref 24–336)

## 2015-10-07 NOTE — Progress Notes (Signed)
Offers no complaints. States is feeling well. Nervous about appt today.

## 2015-10-08 LAB — CARBON MONOXIDE, BLOOD (PERFORMED AT REF LAB): CARBON MONOXIDE, BLOOD: 1.6 % (ref 0.0–3.6)

## 2015-10-08 LAB — ERYTHROPOIETIN: ERYTHROPOIETIN: 5.2 m[IU]/mL (ref 2.6–18.5)

## 2015-10-14 LAB — JAK2 GENOTYPR

## 2015-10-29 ENCOUNTER — Telehealth: Payer: Self-pay | Admitting: *Deleted

## 2015-10-29 NOTE — Telephone Encounter (Signed)
Asking someone call him with lab results

## 2015-10-29 NOTE — Telephone Encounter (Signed)
Patient notified that hemoglobin stable, all other labs within normal limits per Dr. Milinda CaveFinnegan's progress note. Patient verbalized understanding and all questions answered, patient will keep scheduled follow up appointment in 3 months.

## 2015-12-23 ENCOUNTER — Other Ambulatory Visit: Payer: Self-pay | Admitting: *Deleted

## 2015-12-23 DIAGNOSIS — D751 Secondary polycythemia: Secondary | ICD-10-CM

## 2015-12-26 NOTE — Progress Notes (Deleted)
[facility]  Telephone:(336) [phone] Fax:(336) [phone]  ID: Farrel Demarkohith Goughnour OB: 02/28/1990  MR#: [id number]  XBM#:[id number]#:[id number]  Patient Care Team: Tommie SamsJayce G Cook, DO as PCP - General (Family Medicine)  CHIEF COMPLAINT: Polycythemia.  INTERVAL HISTORY: Patient is a 25 year old male who was found to have a mildly elevated hemoglobin on routine blood work. He is anxious, but otherwise feels well. He has no neurologic complaints. He denies any recent fevers or illnesses. He has good appetite and denies weight loss. He denies any pain. He has no chest pain or shortness of breath. He denies any nausea, vomiting, constipation, or diarrhea. He has no urinary complaints. Patient otherwise feels well and offers no further specific complaints.  REVIEW OF SYSTEMS:   Review of Systems  Constitutional: Negative.  Negative for fever, malaise/fatigue and weight loss.  Respiratory: Negative.  Negative for cough and shortness of breath.   Cardiovascular: Negative.  Negative for chest pain.  Gastrointestinal: Negative.  Negative for abdominal pain.  Genitourinary: Negative.   Musculoskeletal: Negative.   Neurological: Negative.  Negative for weakness.  Psychiatric/Behavioral: The patient is nervous/anxious.     As per HPI. Otherwise, a complete review of systems is negative.  PAST MEDICAL HISTORY: Past Medical History:  Diagnosis Date  . Chicken pox   . Cough   . Tonsillitis    multiple times as a child    PAST SURGICAL HISTORY: Past Surgical History:  Procedure Laterality Date  . NO PAST SURGERIES      FAMILY HISTORY: Family History  Problem Relation Age of Onset  . Hypertension Father   . Lung disease Neg Hx        ADVANCED DIRECTIVES (Y/N):  N   HEALTH MAINTENANCE: Social History  Substance Use Topics  . Smoking status: Former Smoker    Packs/day: 0.30    Years: 1.50    Types: Cigarettes    Quit date: 08/14/2014  . Smokeless tobacco: Never Used     Comment:  3-4 cigs a daily  . Alcohol use 0.6 - 1.2 oz/week    1 - 2 Standard drinks or equivalent per week     Comment: occasonally     Colonoscopy:  PAP:  Bone density:  Lipid panel:  No Known Allergies  No current outpatient prescriptions on file.   No current facility-administered medications for this visit.     OBJECTIVE: There were no vitals filed for this visit.   There is no height or weight on file to calculate BMI.    ECOG FS:0 - Asymptomatic  General: Well-developed, well-nourished, no acute distress. Eyes: Pink conjunctiva, anicteric sclera. HEENT: Normocephalic, moist mucous membranes, clear oropharnyx. Lungs: Clear to auscultation bilaterally. Heart: Regular rate and rhythm. No rubs, murmurs, or gallops. Abdomen: Soft, nontender, nondistended. No organomegaly noted, normoactive bowel sounds. Musculoskeletal: No edema, cyanosis, or clubbing. Neuro: Alert, answering all questions appropriately. Cranial nerves grossly intact. Skin: No rashes or petechiae noted. Psych: Normal affect. Lymphatics: No cervical, calvicular, axillary or inguinal LAD.   LAB RESULTS:  Lab Results  Component Value Date   NA 137 08/23/2015   K 4.0 08/23/2015   CL 101 08/23/2015   CO2 28 08/23/2015   GLUCOSE 89 08/23/2015   BUN 13 08/23/2015   CREATININE 0.85 08/23/2015   CALCIUM 9.9 08/23/2015   PROT 7.9 08/23/2015   ALBUMIN 4.5 08/23/2015   AST 19 08/23/2015   ALT 20 08/23/2015   ALKPHOS 86 08/23/2015   BILITOT 0.6 08/23/2015   GFRNONAA >90 03/26/2014  GFRAA >90 03/26/2014    Lab Results  Component Value Date   WBC 7.7 10/07/2015   NEUTROABS 5.4 08/23/2015   HGB 17.1 10/07/2015   HCT 49.4 10/07/2015   MCV 87.4 10/07/2015   PLT 290 10/07/2015   Lab Results  Component Value Date   IRON 97 10/07/2015   TIBC 381 10/07/2015   IRONPCTSAT 26 10/07/2015   Lab Results  Component Value Date   FERRITIN 76 10/07/2015     STUDIES: No results found.  ASSESSMENT:  Polycythemia  PLAN:    1. Polycythemia: Patient's hemoglobin remained stable at 17.1. Iron stores, erythropoietin, and carbon monoxide levels are within normal limits. JAK-2 mutation is pending at time of dictation. No intervention is needed at this time. Return to clinic in 3 months with repeat laboratory work and further evaluation. If hemoglobin remains stable, patient can likely be discharged from clinic.  Approximately 35 minutes was spent in discussion of which greater than 50% was consultation.  Patient expressed understanding and was in agreement with this plan. He also understands that He can call clinic at any time with any questions, concerns, or complaints.    Jeralyn Ruthsimothy J Finnegan, MD   12/26/2015 5:59 PM

## 2015-12-27 ENCOUNTER — Inpatient Hospital Stay: Payer: PRIVATE HEALTH INSURANCE | Admitting: Oncology

## 2015-12-27 ENCOUNTER — Inpatient Hospital Stay: Payer: PRIVATE HEALTH INSURANCE

## 2016-01-13 ENCOUNTER — Ambulatory Visit: Payer: Self-pay | Admitting: Oncology

## 2016-01-13 ENCOUNTER — Other Ambulatory Visit: Payer: Self-pay

## 2016-01-26 ENCOUNTER — Telehealth: Payer: Self-pay | Admitting: Family Medicine

## 2016-01-26 NOTE — Telephone Encounter (Signed)
Pt called wanting to get a referral to see the Urologist. Referral needed please and thank you!  Call pt @ [phone]. Thank you!

## 2016-01-28 ENCOUNTER — Other Ambulatory Visit: Payer: Self-pay | Admitting: Family Medicine

## 2016-01-28 DIAGNOSIS — R361 Hematospermia: Secondary | ICD-10-CM

## 2016-01-28 NOTE — Telephone Encounter (Signed)
Left message to call.

## 2016-01-28 NOTE — Telephone Encounter (Signed)
Patient advised.

## 2016-01-28 NOTE — Telephone Encounter (Signed)
Referral placed.

## 2016-01-28 NOTE — Telephone Encounter (Signed)
Have red blood /brownish color in semen x 5 days

## 2016-01-31 NOTE — Progress Notes (Deleted)
[facility]  Telephone:(336) [phone] Fax:(336) [phone]  ID: Joe Landry OB: 12/01/1990  MR#: [id number]  AOZ#:[id number]#:[id number]  Patient Care Team: Tommie SamsJayce G Cook, DO as PCP - General (Family Medicine)  CHIEF COMPLAINT: Polycythemia, secondary.  INTERVAL HISTORY: Patient is a 25 year old male who was found to have a mildly elevated hemoglobin on routine blood work. He is anxious, but otherwise feels well. He has no neurologic complaints. He denies any recent fevers or illnesses. He has good appetite and denies weight loss. He denies any pain. He has no chest pain or shortness of breath. He denies any nausea, vomiting, constipation, or diarrhea. He has no urinary complaints. Patient otherwise feels well and offers no further specific complaints.  REVIEW OF SYSTEMS:   Review of Systems  Constitutional: Negative.  Negative for fever, malaise/fatigue and weight loss.  Respiratory: Negative.  Negative for cough and shortness of breath.   Cardiovascular: Negative.  Negative for chest pain.  Gastrointestinal: Negative.  Negative for abdominal pain.  Genitourinary: Negative.   Musculoskeletal: Negative.   Neurological: Negative.  Negative for weakness.  Psychiatric/Behavioral: The patient is nervous/anxious.     As per HPI. Otherwise, a complete review of systems is negative.  PAST MEDICAL HISTORY: Past Medical History:  Diagnosis Date  . Chicken pox   . Cough   . Tonsillitis    multiple times as a child    PAST SURGICAL HISTORY: Past Surgical History:  Procedure Laterality Date  . NO PAST SURGERIES      FAMILY HISTORY: Family History  Problem Relation Age of Onset  . Hypertension Father   . Lung disease Neg Hx        ADVANCED DIRECTIVES (Y/N):  N   HEALTH MAINTENANCE: Social History  Substance Use Topics  . Smoking status: Former Smoker    Packs/day: 0.30    Years: 1.50    Types: Cigarettes    Quit date: 08/14/2014  . Smokeless tobacco: Never Used     Comment: 3-4 cigs a daily  . Alcohol use 0.6 - 1.2 oz/week    1 - 2 Standard drinks or equivalent per week     Comment: occasonally     Colonoscopy:  PAP:  Bone density:  Lipid panel:  No Known Allergies  No current outpatient prescriptions on file.   No current facility-administered medications for this visit.     OBJECTIVE: There were no vitals filed for this visit.   There is no height or weight on file to calculate BMI.    ECOG FS:0 - Asymptomatic  General: Well-developed, well-nourished, no acute distress. Eyes: Pink conjunctiva, anicteric sclera. HEENT: Normocephalic, moist mucous membranes, clear oropharnyx. Lungs: Clear to auscultation bilaterally. Heart: Regular rate and rhythm. No rubs, murmurs, or gallops. Abdomen: Soft, nontender, nondistended. No organomegaly noted, normoactive bowel sounds. Musculoskeletal: No edema, cyanosis, or clubbing. Neuro: Alert, answering all questions appropriately. Cranial nerves grossly intact. Skin: No rashes or petechiae noted. Psych: Normal affect. Lymphatics: No cervical, calvicular, axillary or inguinal LAD.   LAB RESULTS:  Lab Results  Component Value Date   NA 137 08/23/2015   K 4.0 08/23/2015   CL 101 08/23/2015   CO2 28 08/23/2015   GLUCOSE 89 08/23/2015   BUN 13 08/23/2015   CREATININE 0.85 08/23/2015   CALCIUM 9.9 08/23/2015   PROT 7.9 08/23/2015   ALBUMIN 4.5 08/23/2015   AST 19 08/23/2015   ALT 20 08/23/2015   ALKPHOS 86 08/23/2015   BILITOT 0.6 08/23/2015   GFRNONAA >90 03/26/2014  GFRAA >90 03/26/2014    Lab Results  Component Value Date   WBC 7.7 10/07/2015   NEUTROABS 5.4 08/23/2015   HGB 17.1 10/07/2015   HCT 49.4 10/07/2015   MCV 87.4 10/07/2015   PLT 290 10/07/2015   Lab Results  Component Value Date   IRON 97 10/07/2015   TIBC 381 10/07/2015   IRONPCTSAT 26 10/07/2015   Lab Results  Component Value Date   FERRITIN 76 10/07/2015     STUDIES: No results found.  ASSESSMENT:  Polycythemia, secondary  PLAN:    1. Polycythemia, secondary: Patient's hemoglobin remained stable at 17.1. Iron stores, erythropoietin, and carbon monoxide levels are within normal limits. JAK-2 mutation is pending at time of dictation. No intervention is needed at this time. Return to clinic in 3 months with repeat laboratory work and further evaluation. If hemoglobin remains stable, patient can likely be discharged from clinic.  Approximately 35 minutes was spent in discussion of which greater than 50% was consultation.  Patient expressed understanding and was in agreement with this plan. He also understands that He can call clinic at any time with any questions, concerns, or complaints.    Jeralyn Ruthsimothy J Finnegan, MD   01/31/2016 10:21 PM

## 2016-02-02 ENCOUNTER — Inpatient Hospital Stay: Payer: PRIVATE HEALTH INSURANCE

## 2016-02-02 ENCOUNTER — Inpatient Hospital Stay: Payer: PRIVATE HEALTH INSURANCE | Admitting: Oncology

## 2016-02-14 NOTE — Progress Notes (Deleted)
[facility]  [phone] [phone]  ID: Joe Landry OB: January 18, 1991  MR#: [id number]  XLK#:[id number]  Patient Care Team: Tommie Sams, DO as PCP - General (Family Medicine)  CHIEF COMPLAINT: Polycythemia, secondary.  INTERVAL HISTORY: Patient is a 26 year old male who was found to have a mildly elevated hemoglobin on routine blood work. He is anxious, but otherwise feels well. He has no neurologic complaints. He denies any recent fevers or illnesses. He has good appetite and denies weight loss. He denies any pain. He has no chest pain or shortness of breath. He denies any nausea, vomiting, constipation, or diarrhea. He has no urinary complaints. Patient otherwise feels well and offers no further specific complaints.  REVIEW OF SYSTEMS:   Review of Systems  Constitutional: Negative.  Negative for fever, malaise/fatigue and weight loss.  Respiratory: Negative.  Negative for cough and shortness of breath.   Cardiovascular: Negative.  Negative for chest pain.  Gastrointestinal: Negative.  Negative for abdominal pain.  Genitourinary: Negative.   Musculoskeletal: Negative.   Neurological: Negative.  Negative for weakness.  Psychiatric/Behavioral: The patient is nervous/anxious.     As per HPI. Otherwise, a complete review of systems is negative.  PAST MEDICAL HISTORY: Past Medical History:  Diagnosis Date  . Chicken pox   . Cough   . Tonsillitis    multiple times as a child    PAST SURGICAL HISTORY: Past Surgical History:  Procedure Laterality Date  . NO PAST SURGERIES      FAMILY HISTORY: Family History  Problem Relation Age of Onset  . Hypertension Father   . Lung disease Neg Hx        ADVANCED DIRECTIVES (Y/N):  N   HEALTH MAINTENANCE: Social History  Substance Use Topics  . Smoking status: Former Smoker    Packs/day: 0.30    Years: 1.50    Types: Cigarettes    Quit date: 08/14/2014  . Smokeless tobacco: Never Used     Comment: 3-4 cigs a daily  . Alcohol use 0.6 - 1.2 oz/week    1 - 2 Standard drinks or equivalent per week     Comment: occasonally     Colonoscopy:  PAP:  Bone density:  Lipid panel:  No Known Allergies  No current outpatient prescriptions on file.   No current facility-administered medications for this visit.     OBJECTIVE: There were no vitals filed for this visit.   There is no height or weight on file to calculate BMI.    ECOG FS:0 - Asymptomatic  General: Well-developed, well-nourished, no acute distress. Eyes: Pink conjunctiva, anicteric sclera. HEENT: Normocephalic, moist mucous membranes, clear oropharnyx. Lungs: Clear to auscultation bilaterally. Heart: Regular rate and rhythm. No rubs, murmurs, or gallops. Abdomen: Soft, nontender, nondistended. No organomegaly noted, normoactive bowel sounds. Musculoskeletal: No edema, cyanosis, or clubbing. Neuro: Alert, answering all questions appropriately. Cranial nerves grossly intact. Skin: No rashes or petechiae noted. Psych: Normal affect. Lymphatics: No cervical, calvicular, axillary or inguinal LAD.   LAB RESULTS:  Lab Results  Component Value Date   NA 137 08/23/2015   K 4.0 08/23/2015   CL 101 08/23/2015   CO2 28 08/23/2015   GLUCOSE 89 08/23/2015   BUN 13 08/23/2015   CREATININE 0.85 08/23/2015   CALCIUM 9.9 08/23/2015   PROT 7.9 08/23/2015   ALBUMIN 4.5 08/23/2015   AST 19 08/23/2015   ALT 20 08/23/2015   ALKPHOS 86 08/23/2015   BILITOT 0.6 08/23/2015   GFRNONAA >90 03/26/2014  GFRAA >90 03/26/2014    Lab Results  Component Value Date   WBC 7.7 10/07/2015   NEUTROABS 5.4 08/23/2015   HGB 17.1 10/07/2015   HCT 49.4 10/07/2015   MCV 87.4 10/07/2015   PLT 290 10/07/2015   Lab Results  Component Value Date   IRON 97 10/07/2015   TIBC 381 10/07/2015   IRONPCTSAT 26 10/07/2015   Lab Results  Component Value Date   FERRITIN 76 10/07/2015     STUDIES: No results found.  ASSESSMENT:  Polycythemia, secondary  PLAN:    1. Polycythemia, secondary: Patient's hemoglobin remained stable at 17.1. Iron stores, erythropoietin, and carbon monoxide levels are within normal limits. JAK-2 mutation is pending at time of dictation. No intervention is needed at this time. Return to clinic in 3 months with repeat laboratory work and further evaluation. If hemoglobin remains stable, patient can likely be discharged from clinic.  Approximately 35 minutes was spent in discussion of which greater than 50% was consultation.  Patient expressed understanding and was in agreement with this plan. He also understands that He can call clinic at any time with any questions, concerns, or complaints.    Jeralyn Ruthsimothy J Joana Nolton, MD   02/14/2016 11:23 PM

## 2016-02-15 ENCOUNTER — Inpatient Hospital Stay: Payer: PRIVATE HEALTH INSURANCE

## 2016-02-15 ENCOUNTER — Encounter: Payer: Self-pay | Admitting: *Deleted

## 2016-02-15 ENCOUNTER — Inpatient Hospital Stay: Payer: PRIVATE HEALTH INSURANCE | Admitting: Oncology

## 2016-07-21 ENCOUNTER — Ambulatory Visit
Admission: EM | Admit: 2016-07-21 | Discharge: 2016-07-21 | Disposition: A | Discharge status: Home / Self Care | Payer: PRIVATE HEALTH INSURANCE | Attending: Family Medicine | Admitting: Family Medicine

## 2016-07-21 DIAGNOSIS — D751 Secondary polycythemia: Secondary | ICD-10-CM | POA: Insufficient documentation

## 2016-07-21 DIAGNOSIS — Z87891 Personal history of nicotine dependence: Secondary | ICD-10-CM | POA: Insufficient documentation

## 2016-07-21 DIAGNOSIS — F419 Anxiety disorder, unspecified: Secondary | ICD-10-CM | POA: Diagnosis present

## 2016-07-21 LAB — GLUCOSE, CAPILLARY: Glucose-Capillary: 105 mg/dL — ABNORMAL HIGH (ref 65–99)

## 2016-07-21 MED ORDER — SERTRALINE HCL 50 MG PO TABS: 50.0000 mg | ORAL_TABLET | Freq: Every day | ORAL | 0 refills | Status: AC

## 2016-07-21 NOTE — ED Triage Notes (Signed)
Patient complains of anxiety that has started recently. Patient states that he drank heavily over the weekend and seemed to be exacerbated. Patient reports that he has had an increase in appetite and hands shaking. Patient states that he has been on [address] before back in [place].

## 2016-07-21 NOTE — ED Provider Notes (Signed)
[facility]    CSN: [id number] Arrival date & time: 07/21/16  [id number]     History   Chief Complaint Chief Complaint  Patient presents with  . Anxiety    HPI Joe Landry is a 26 y.o. male.   26 yo male with a history of anxiety presents with a c/o worsening anxiety for the past week. States he was drinking last weekend and since then has felt anxious. States in the past he was treated with zoloft for his anxiety and this helped. Denies any suicidal or homicidal ideation. States otherwise healthy.     The history is provided by the patient.  Anxiety  This is a recurrent problem.    Past Medical History:  Diagnosis Date  . Chicken pox   . Cough   . Tonsillitis    multiple times as a child    Patient Active Problem List   Diagnosis Date Noted  . Polycythemia, secondary 10/06/2015  . Recurrent fever 08/24/2015    Past Surgical History:  Procedure Laterality Date  . NO PAST SURGERIES         Home Medications    Prior to Admission medications   Medication Sig Start Date End Date Taking? Authorizing Provider  sertraline (ZOLOFT) 50 MG tablet Take 1 tablet (50 mg total) by mouth daily. 07/21/16   Joe Mccallumonty, Guynell Kleiber, MD    Family History Family History  Problem Relation Age of Onset  . Hypertension Father   . Lung disease Neg Hx     Social History Social History  Substance Use Topics  . Smoking status: Former Smoker    Packs/day: 0.30    Years: 1.50    Types: Cigarettes    Quit date: 08/14/2014  . Smokeless tobacco: Never Used     Comment: 3-4 cigs a daily  . Alcohol use 0.6 - 1.2 oz/week    1 - 2 Standard drinks or equivalent per week     Comment: occasonally     Allergies   Patient has no known allergies.   Review of Systems Review of Systems   Physical Exam Triage Vital Signs ED Triage Vitals  Enc Vitals Group     BP 07/21/16 0838 137/83     Pulse Rate 07/21/16 0838 80     Resp 07/21/16 0838 16     Temp 07/21/16 0838 98.2 F  (36.8 C)     Temp Source 07/21/16 0838 Oral     SpO2 07/21/16 0838 100 %     Weight 07/21/16 0836 188 lb (85.3 kg)     Height 07/21/16 0836 6' (1.829 m)     Head Circumference --      Peak Flow --      Pain Score --      Pain Loc --      Pain Edu? --      Excl. in GC? --    No data found.   Updated Vital Signs BP 137/83 (BP Location: Left Arm)   Pulse 80   Temp 98.2 F (36.8 C) (Oral)   Resp 16   Ht 6' (1.829 m)   Wt 188 lb (85.3 kg)   SpO2 100%   BMI 25.50 kg/m   Visual Acuity Right Eye Distance:   Left Eye Distance:   Bilateral Distance:    Right Eye Near:   Left Eye Near:    Bilateral Near:     Physical Exam  Constitutional: He appears well-developed and well-nourished. No distress.  Cardiovascular:  Normal rate.   Skin: He is not diaphoretic.  Psychiatric: His speech is normal and behavior is normal. Judgment and thought content normal. His mood appears anxious. He is not actively hallucinating. Cognition and memory are normal. He does not exhibit a depressed mood. He is attentive.  Vitals reviewed.    UC Treatments / Results  Labs (all labs ordered are listed, but only abnormal results are displayed) Labs Reviewed  GLUCOSE, CAPILLARY - Abnormal; Notable for the following:       Result Value   Glucose-Capillary 105 (*)    All other components within normal limits  CBG MONITORING, ED    EKG  EKG Interpretation None       Radiology No results found.  Procedures Procedures (including critical care time)  Medications Ordered in UC Medications - No data to display   Initial Impression / Assessment and Plan / UC Course  I have reviewed the triage vital signs and the nursing notes.  Pertinent labs & imaging results that were available during my care of the patient were reviewed by me and considered in my medical decision making (see chart for details).      Final Clinical Impressions(s) / UC Diagnoses   Final diagnoses:  Anxiety     New Prescriptions Discharge Medication List as of 07/21/2016  9:12 AM    START taking these medications   Details  sertraline (ZOLOFT) 50 MG tablet Take 1 tablet (50 mg total) by mouth daily., Starting Fri 07/21/2016, Normal       1. diagnosis reviewed with patient 2. rx as per orders above; reviewed possible side effects, interactions, risks and benefits  3. Recommend supportive treatment with relaxation exercises 4. Establish care with PCP; Follow-up prn if symptoms worsen or don't improve   Joe Mccallum, MD 07/21/16 1251

## 2017-11-27 IMAGING — CR DG SINUSES COMPLETE 3+V
4 series · 4 of 4 positions shown · non-contrast
Comparison: None impacts

CLINICAL DATA: Four months of intermittently productive cough,
former smoker

EXAM:
PARANASAL SINUSES - COMPLETE 3 + VIEW

[[person_name] *]
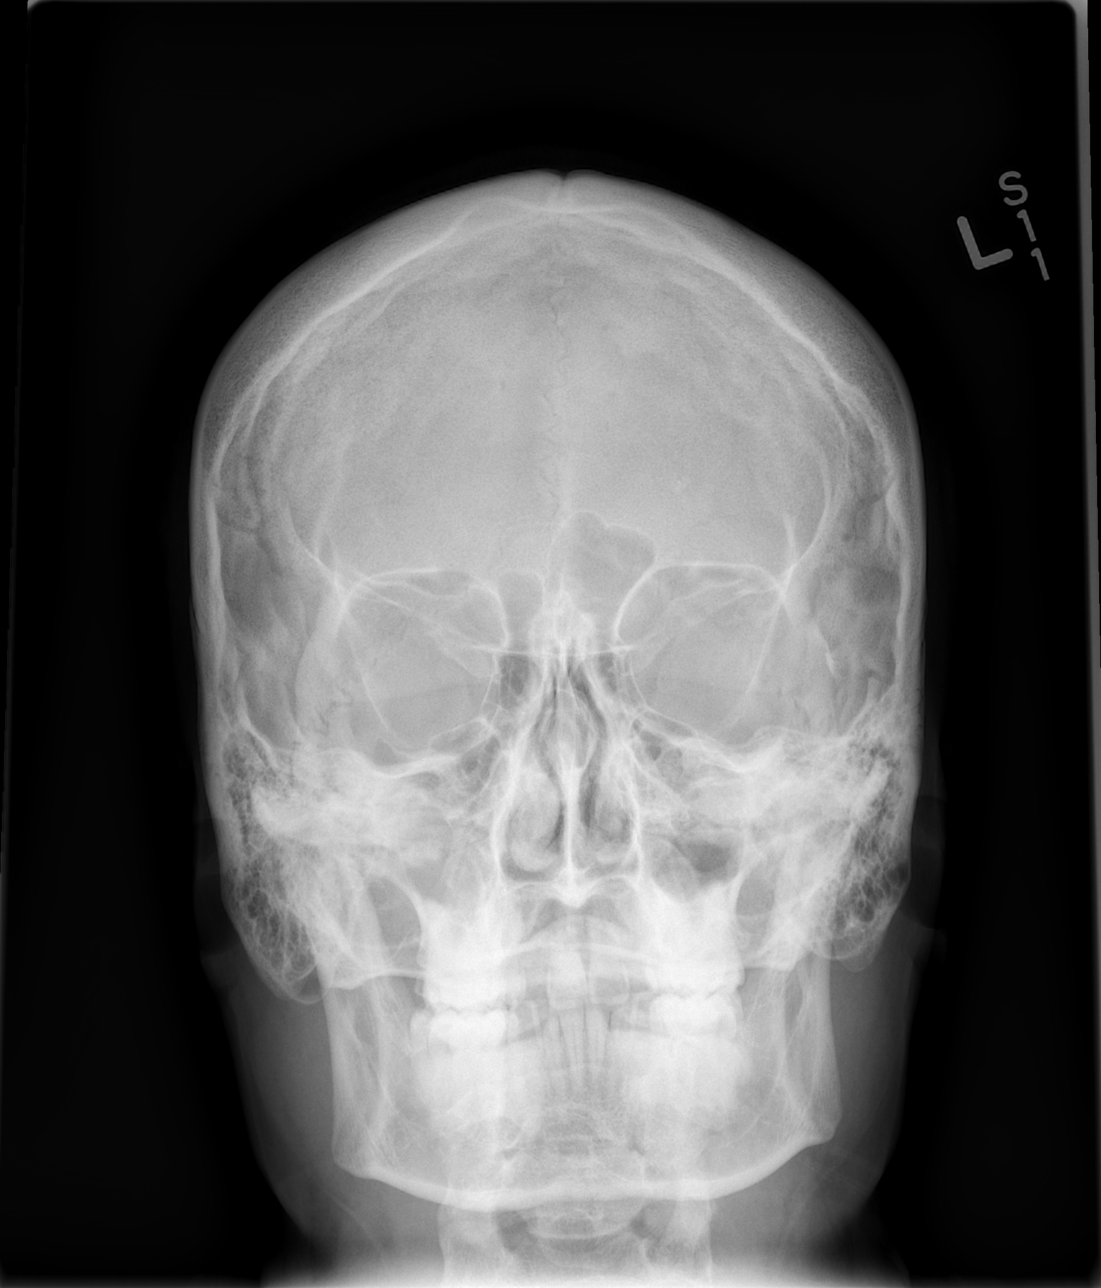

[w waters]
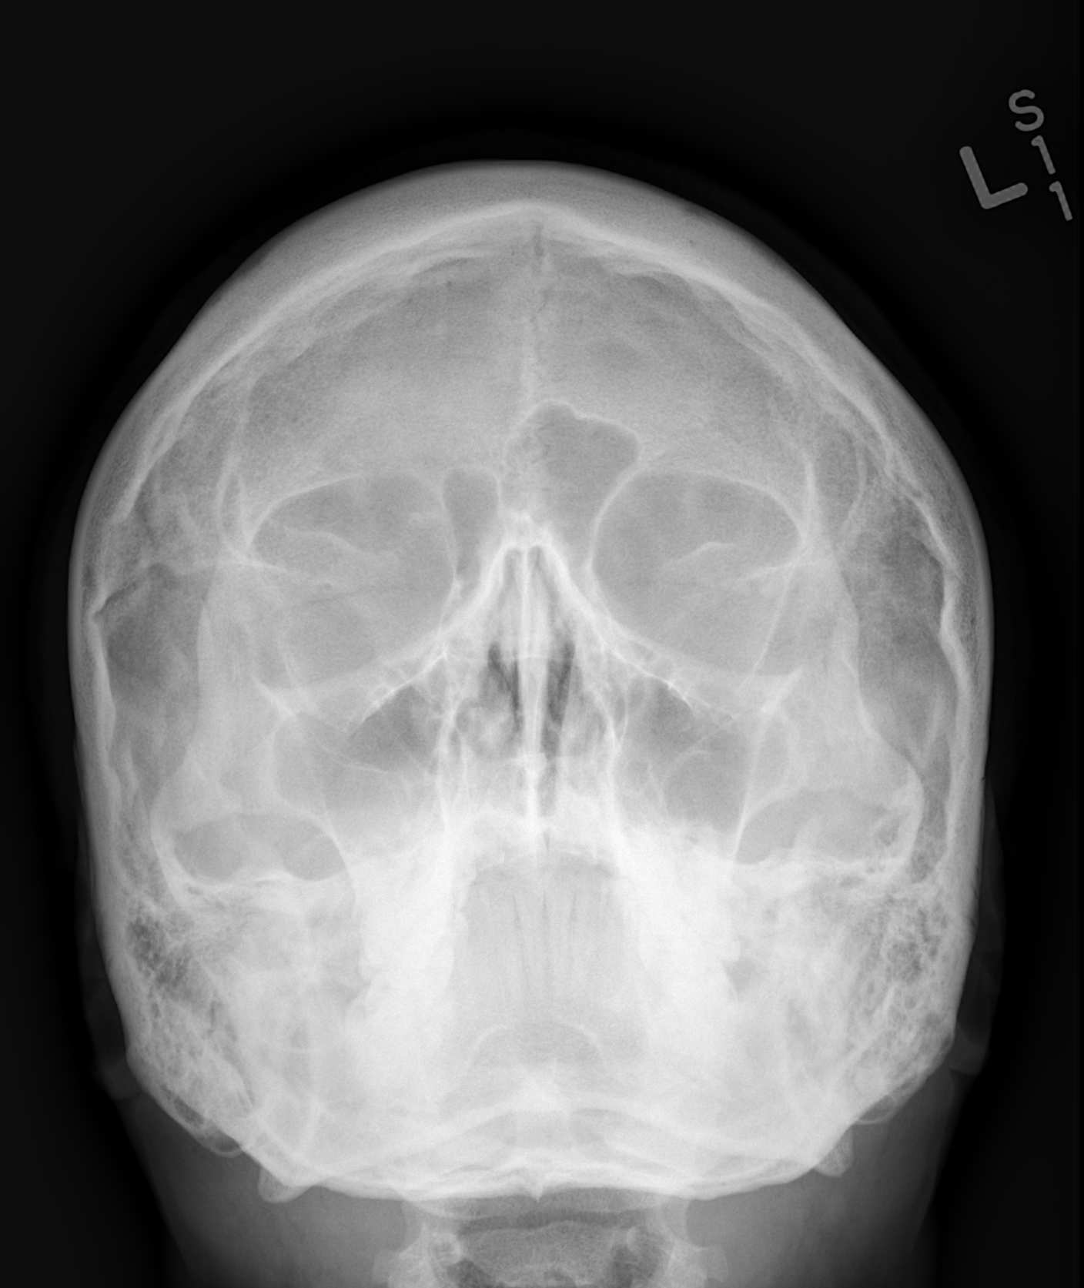

[w skull lat]
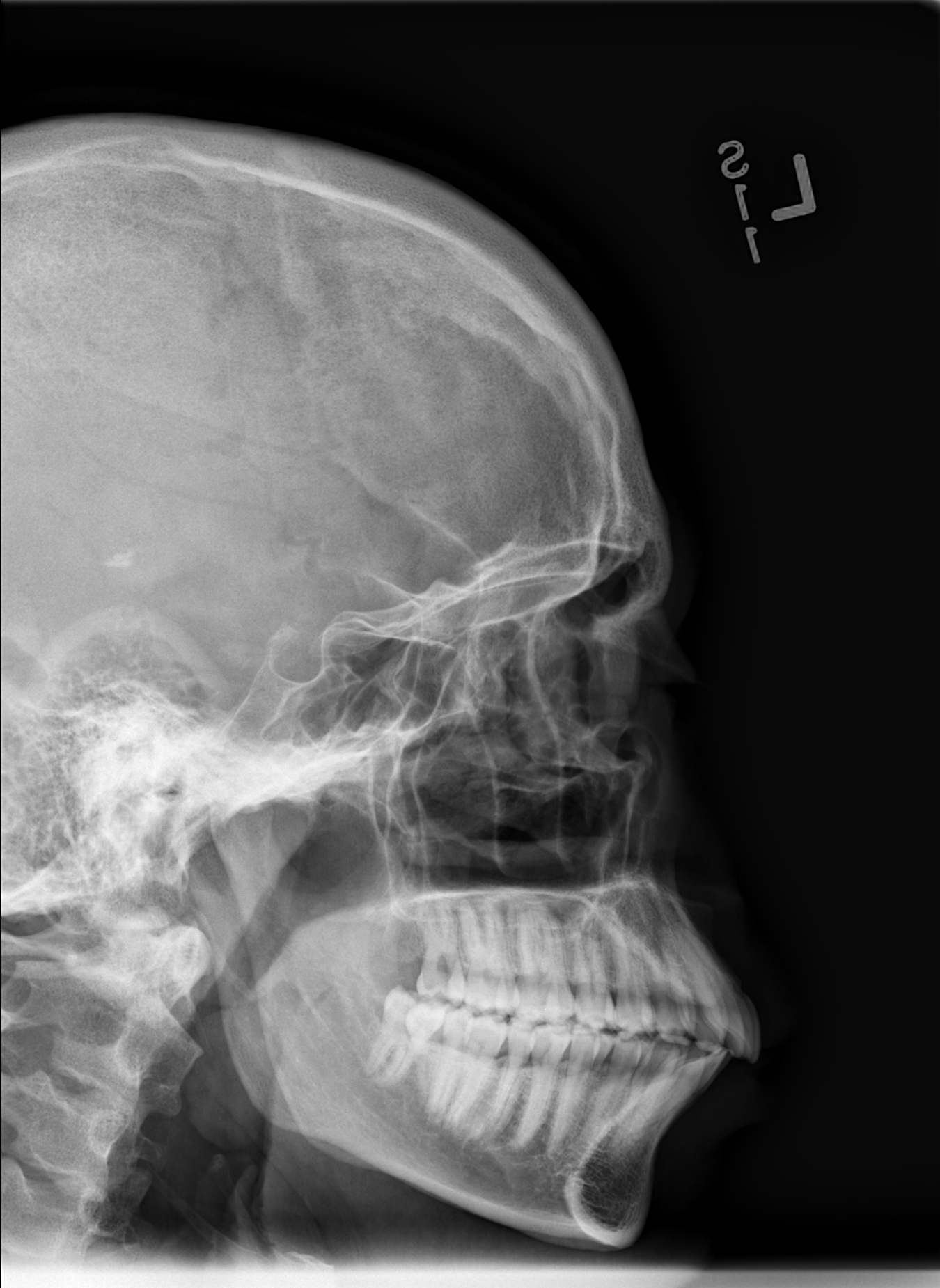

[w smv]
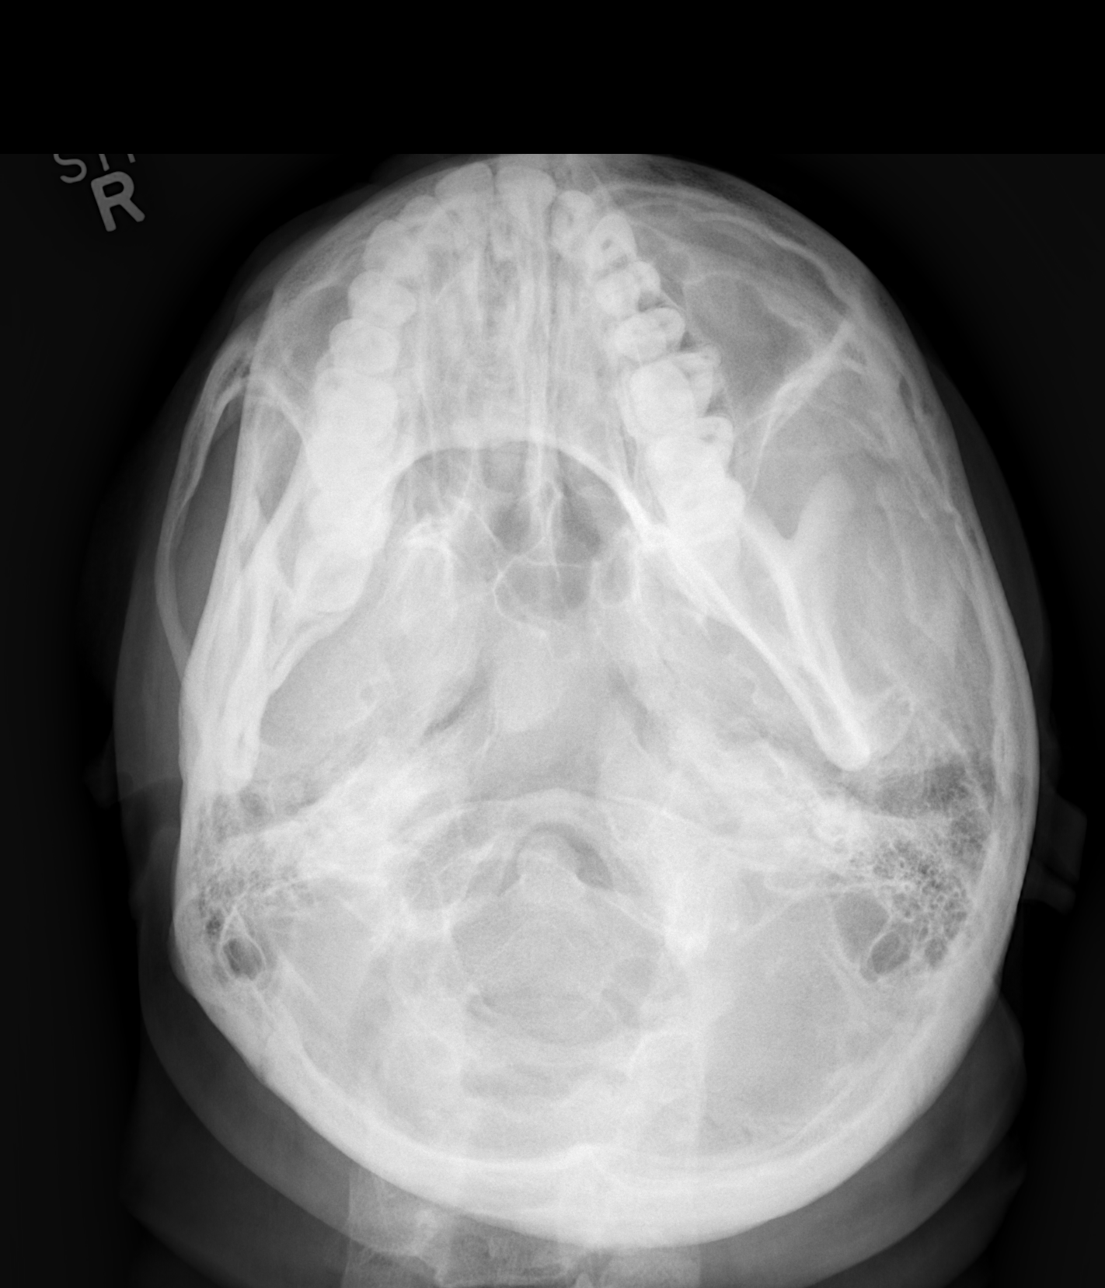

[4 of 4 positions shown; findings below may reference images not displayed]

FINDINGS: The right frontal sinus is small. Knee there frontal sinus cells
exhibits mucoperiosteal thickening or air-fluid levels. There is
some haziness of the maxillary sinuses bilaterally suspicious for
mucoperiosteal thickening. No definite air-fluid levels are
observed. The ethmoid and sphenoid sinuses are grossly clear.
IMPRESSION: Probable mucoperiosteal thickening within the maxillary sinuses
bilaterally. No definite air-fluid levels are observed.
